# Patient Record
Sex: Female | Born: 1960 | Race: Asian | Hispanic: No | Marital: Married | Smoking: Never smoker
Health system: Western US, Academic
[De-identification: ages and names within clinical notes are randomized; demographics above are authoritative.]

---

## 2003-12-14 ENCOUNTER — Emergency Department (HOSPITAL_COMMUNITY): Admission: EM | Admit: 2003-12-14 | Discharge: 2003-12-14 | Payer: Self-pay | Admitting: Emergency Medicine

## 2006-04-20 ENCOUNTER — Inpatient Hospital Stay (HOSPITAL_COMMUNITY): Admission: AD | Admit: 2006-04-20 | Discharge: 2006-04-20 | Payer: Self-pay | Admitting: Obstetrics and Gynecology

## 2006-05-20 ENCOUNTER — Ambulatory Visit: Payer: Self-pay | Admitting: Obstetrics and Gynecology

## 2006-05-28 ENCOUNTER — Encounter (INDEPENDENT_AMBULATORY_CARE_PROVIDER_SITE_OTHER): Payer: Self-pay | Admitting: *Deleted

## 2006-05-28 ENCOUNTER — Ambulatory Visit: Payer: Self-pay | Admitting: Gynecology

## 2006-05-28 ENCOUNTER — Encounter (INDEPENDENT_AMBULATORY_CARE_PROVIDER_SITE_OTHER): Payer: Self-pay | Admitting: Gynecology

## 2007-06-02 ENCOUNTER — Emergency Department (HOSPITAL_COMMUNITY): Admission: EM | Admit: 2007-06-02 | Discharge: 2007-06-02 | Payer: Self-pay | Admitting: Emergency Medicine

## 2010-03-31 ENCOUNTER — Encounter: Payer: Self-pay | Admitting: *Deleted

## 2010-07-26 NOTE — Group Therapy Note (Signed)
[name], [name] NO.:  [id number]   MEDICAL RECORD NO.:  [id number]          PATIENT TYPE:  WOC   LOCATION:  [facility]                   FACILITY:  [facility]   PHYSICIAN:  Ginger Carne, MD DATE OF BIRTH:  01/10/1961   DATE OF SERVICE:  05/28/2006                                  CLINIC NOTE   The patient is here today for routine annual physical exam and Pap.  She  had 1 episode of heavy bleeding in February of 2008 seen in the [facility].  At  that time her laboratory work was within normal limits.  Specifically,  her H&H was 39.6 and 13.9 with a negative serum pregnancy test.  She  presents today with no specific complaints.  Menses regular every 30  days lasting 3-4 days.  She has had 3 full-term pregnancies in the past.  No specific medical problems and interpreter is with the patient.   SALIENT PHYSICAL EXAM FINDINGS:  Blood pressure 115/78, weight 139  pounds, height 5 feet 5-1/4 inches.  HEENT:  Grossly normal.  Breast exam without masses, discharge, thickenings or tenderness.  CHEST:  Clear to percussion and auscultation.  Cardiovascular exam without murmurs or enlargements.  Regular rate and  rhythm.  Extremities, lymphatic, skin, neurological, musculoskeletal systems  within normal limits.  ABDOMEN:  Soft without gross hepatosplenomegaly.  PELVIC:  Pap smear performed external genitalia, vulva, and vagina  normal.  Cervix smooth without erosions or lesions.   IMPRESSION:  Normal exam.   PLAN:  Mammogram ordered.           ______________________________  Ginger Carne, MD     SHB/MEDQ  D:  05/28/2006  T:  05/28/2006  Job:  [id number]

## 2010-12-02 LAB — POCT I-STAT, CHEM 8
BUN: 11
Calcium, Ion: 1.18
Chloride: 108
Creatinine, Ser: 0.9
Glucose, Bld: 115 — ABNORMAL HIGH
HCT: 40
Hemoglobin: 13.6
Potassium: 4
Sodium: 141
TCO2: 23

## 2010-12-02 LAB — POCT CARDIAC MARKERS
CKMB, poc: <1 — ABNORMAL LOW
Myoglobin, poc: 28.6
Operator id: 295131
Troponin i, poc: <0.05

## 2010-12-02 LAB — ETHANOL: Alcohol, Ethyl (B): 37 — ABNORMAL HIGH

## 2010-12-14 ENCOUNTER — Emergency Department (HOSPITAL_COMMUNITY)
Admission: EM | Admit: 2010-12-14 | Discharge: 2010-12-15 | Disposition: A | Discharge status: Home / Self Care | Payer: Medicaid Other | Attending: Emergency Medicine | Admitting: Emergency Medicine

## 2010-12-14 ENCOUNTER — Emergency Department (HOSPITAL_COMMUNITY): Payer: Medicaid Other

## 2010-12-14 DIAGNOSIS — S61209A Unspecified open wound of unspecified finger without damage to nail, initial encounter: Secondary | ICD-10-CM | POA: Insufficient documentation

## 2010-12-14 DIAGNOSIS — S8010XA Contusion of unspecified lower leg, initial encounter: Secondary | ICD-10-CM | POA: Insufficient documentation

## 2010-12-14 DIAGNOSIS — W540XXA Bitten by dog, initial encounter: Secondary | ICD-10-CM | POA: Insufficient documentation

## 2010-12-14 DIAGNOSIS — M79609 Pain in unspecified limb: Secondary | ICD-10-CM | POA: Insufficient documentation

## 2010-12-14 DIAGNOSIS — S81009A Unspecified open wound, unspecified knee, initial encounter: Secondary | ICD-10-CM | POA: Insufficient documentation

## 2010-12-14 DIAGNOSIS — S91009A Unspecified open wound, unspecified ankle, initial encounter: Secondary | ICD-10-CM | POA: Insufficient documentation

## 2013-07-11 ENCOUNTER — Emergency Department (HOSPITAL_COMMUNITY): Payer: Medicaid Other

## 2013-07-11 ENCOUNTER — Emergency Department (HOSPITAL_COMMUNITY)
Admission: EM | Admit: 2013-07-11 | Discharge: 2013-07-11 | Disposition: A | Discharge status: Home / Self Care | Payer: Medicaid Other | Attending: Emergency Medicine | Admitting: Emergency Medicine

## 2013-07-11 ENCOUNTER — Encounter (HOSPITAL_COMMUNITY): Payer: Self-pay | Admitting: Emergency Medicine

## 2013-07-11 DIAGNOSIS — M5116 Intervertebral disc disorders with radiculopathy, lumbar region: Secondary | ICD-10-CM

## 2013-07-11 DIAGNOSIS — R209 Unspecified disturbances of skin sensation: Secondary | ICD-10-CM | POA: Insufficient documentation

## 2013-07-11 DIAGNOSIS — IMO0002 Reserved for concepts with insufficient information to code with codable children: Secondary | ICD-10-CM | POA: Insufficient documentation

## 2013-07-11 DIAGNOSIS — M51379 Other intervertebral disc degeneration, lumbosacral region without mention of lumbar back pain or lower extremity pain: Secondary | ICD-10-CM | POA: Insufficient documentation

## 2013-07-11 DIAGNOSIS — Z791 Long term (current) use of non-steroidal anti-inflammatories (NSAID): Secondary | ICD-10-CM | POA: Insufficient documentation

## 2013-07-11 DIAGNOSIS — M5137 Other intervertebral disc degeneration, lumbosacral region: Secondary | ICD-10-CM | POA: Insufficient documentation

## 2013-07-11 DIAGNOSIS — R5383 Other fatigue: Secondary | ICD-10-CM

## 2013-07-11 DIAGNOSIS — R5381 Other malaise: Secondary | ICD-10-CM | POA: Insufficient documentation

## 2013-07-11 MED ORDER — IBUPROFEN 200 MG PO TABS
600.0000 mg | ORAL_TABLET | Freq: Once | ORAL | Status: AC
  Administered 2013-07-11: 600 mg via ORAL
  Filled 2013-07-11: qty 3

## 2013-07-11 MED ORDER — ONDANSETRON 4 MG PO TBDP
8.0000 mg | ORAL_TABLET | Freq: Once | ORAL | Status: AC
  Administered 2013-07-11: 8 mg via ORAL
  Filled 2013-07-11: qty 2

## 2013-07-11 MED ORDER — OXYCODONE-ACETAMINOPHEN 5-325 MG PO TABS
2.0000 | ORAL_TABLET | Freq: Once | ORAL | Status: AC
  Administered 2013-07-11: 2 via ORAL
  Filled 2013-07-11: qty 2

## 2013-07-11 MED ORDER — DIAZEPAM 2 MG PO TABS
2.0000 mg | ORAL_TABLET | Freq: Once | ORAL | Status: AC
  Administered 2013-07-11: 2 mg via ORAL
  Filled 2013-07-11: qty 1

## 2013-07-11 MED ORDER — IBUPROFEN 400 MG PO TABS: 400.0000 mg | ORAL_TABLET | Freq: Four times a day (QID) | ORAL | Status: AC | PRN

## 2013-07-11 MED ORDER — PREDNISONE 50 MG PO TABS: 50.0000 mg | ORAL_TABLET | Freq: Every day | ORAL | Status: AC

## 2013-07-11 MED ORDER — ONDANSETRON HCL 4 MG PO TABS: 4.0000 mg | ORAL_TABLET | Freq: Four times a day (QID) | ORAL | Status: AC

## 2013-07-11 MED ORDER — HYDROCODONE-ACETAMINOPHEN 5-325 MG PO TABS: 1.0000 | ORAL_TABLET | Freq: Four times a day (QID) | ORAL | Status: AC | PRN

## 2013-07-11 MED ORDER — METHOCARBAMOL 500 MG PO TABS: 500.0000 mg | ORAL_TABLET | Freq: Two times a day (BID) | ORAL | Status: AC

## 2013-07-11 NOTE — ED Notes (Signed)
Pt vomited after xray requested med

## 2013-07-11 NOTE — ED Notes (Signed)
Pt was still nauseated abd given rx for zofran to take home

## 2013-07-11 NOTE — Discharge Instructions (Signed)
We saw you in the ER for back pain. Our exam don't indicate any spinal cord involvement - and thus we feel comfortable sending you home. WE SUSPECT SEVERE DISK DISEASE. Please take the ibuprofen every 6 hours for the next 2 days, take the muscle relaxant as needed, and see your primary care doctor for further pain control.  Please use the back exercises to strengthen the back muscles. See the neuro  Surgeon as requested.  RESOURCE GUIDE  Chronic Pain Problems: Contact [facility]  [phone] Patients need to be referred by their primary care doctor.  Insufficient Money for Medicine: Contact [employer] Way:  call "211."   No Primary Care Doctor: - Call [facility]  [phone] - can help you locate a primary care doctor that  accepts your insurance, provides certain services, etc. - Physician Referral Service- [phone]  Agencies that provide inexpensive medical care: - [facility]  [phone] - [facility]  [phone] - [facility]  [phone] - Women's Clinic  [phone] - [facility]  [phone] [facility]  [phone]  Medicaid-accepting [facility] Providers: - [facility]- [address]  [phone], Mon-Fri 9am-7pm, Sat 9am-1pm - [facility]- [address] [place], [address] [place]  [phone] - [facility]- [address], [address] [place]  [phone] [facility]- [address]  [phone] - Renaye Rakers- [address] [place], [address], [phone]  Only accepts [place] patients after they have their name  applied to their card  Self Pay (no insurance) in [place]: - Sickle Cell Patients: Dr Willey Blade, [facility]  [address] [place], [phone] - [facility]- [address] [place]  [phone]       [facility] [place]- [address]        -     [facility]- see information above (Speak to [employer] if you do not have insurance)       -  [facility]- [address],  [phone]       -  [facility]- [address] [phone]       -  Dr Julio Sicks-  [address], [place], [phone]       -  [facility] - [address] [phone]       -  [facility]- [address] [phone], also [address], [phone]       -    [facility]- [address] [place], [phone], 1st & 3rd Saturday        every month, 10am-1pm  [facility] [address] [place], [place] [address] [phone]  [facility] [address] [place], [place] [address] [phone]  1) Find a Doctor and Pay Out of Pocket Although you won't have to find out who is covered by your insurance plan, it is a good idea to ask around and get recommendations. You will then need to call the office and see if the doctor you have chosen will accept you as a new patient and what types of options they offer for patients who are self-pay. Some doctors offer discounts or will set up payment plans for their patients  who do not have insurance, but you will need to ask so you aren't surprised when you get to your appointment.  2) Contact Your Local Health Department Not all health departments have doctors that can see patients for sick visits, but many do, so it is worth a call to see if yours does. If you don't know where your local health department is, you can check in your phone book. The CDC also has a tool to help you locate your state's health department, and many state websites also have listings of all of their local health departments.  3) Find a Walk-in Clinic If your illness is not likely to be very severe or complicated, you may want to try a walk in clinic. These are popping up all over the country in pharmacies, drugstores, and  shopping centers. They're usually staffed by nurse practitioners or physician assistants that have been trained to treat common illnesses and complaints. They're usually fairly quick and inexpensive. However, if you have serious medical issues or chronic medical problems, these are probably not your best option  STD Testing - [facility] of [facility], STD Clinic, [address] [place], phone [phone] or [phone].  Monday - Friday, call for an appointment. [facility] of [employer], STD Clinic, [address], [place], phone [phone] or [phone].  Monday - Friday, call for an appointment.  Abuse/Neglect: [facility] Child Abuse Hotline 863-343-9191 [facility] Child Abuse Hotline [phone]) 534-8167 (After Hours)  Emergency Shelter:  [facility] [phone]  Maternity Homes: - [facility] [phone] - [facility] [phone]  MRSA Hotline #:   (651)575-7662  Dental Assistance If unable to pay or uninsured, contact:  [facility]. to become qualified for the adult dental clinic.  Patients with Medicaid: [facility] [phone] [address], [phone] [address], [phone]  If unable to pay, or uninsured, contact [facility] [phone] in [place], [phone] in [facility]) to become qualified for the adult dental clinic  [facility] [address] [place], [place] [address] [phone] [email or web address]  Other Proofreader Services: - [facility]- [address] [place], [place], [place], [address], [phone], Ext. [phone], 2nd and 4th Thursday of the month at 6:30am.  10 clients each day by appointment, can sometimes see walk-in patients if someone does not show for an appointment. [facility]- [address] [place], [place], [address],  [phone] - [facility]- [address] [place], [place], [address], [phone] [facility]- [phone] [facility]- [phone] [facility] [phone]          Herniated Disk The bones of your spinal column (vertebrae) protect your spinal cord and nerves that go into your arms and legs. The vertebrae are separated by disks that cushion the spinal column and put space between your vertebrae. This allows movement between the vertebrae, which allows you to bend, rotate, and move your body from side to side. Sometimes, the disks move out of place (herniate) or break open (rupture) from injury or strain. The most common area for a disk herniation is in the lower back (lumbar area). Sometimes herniation occurs in the neck (cervical) disks.  CAUSES  As we grow older, the strong, fibrous cords that connect the vertebrae and support and surround the disks (  ligaments) start to weaken. A strain on the back may cause a break in the disk ligaments. RISK FACTORS Herniated disks occur most often in men who are aged 18 years to 35 years, usually after strenuous activity. Other risk factors include conditions present at birth (congenital) that affect the size of the lumbar spinal canal. Additionally, a narrowing of the areas where the nerves exit the spinal canal can occur as you age. SYMPTOMS  Symptoms of a herniated disk vary. You may have weakness in certain muscles. This weakness can include difficulty lifting your leg or arm, difficulty standing on your toes on one side, or difficulty squeezing tightly with one of your hands. You may have numbness. You may feel a mild tingling, dull ache, or a burning or pulsating pain. In some cases, the pain is severe enough that you are unable to move. The pain most often occurs on one side of the body. The pain often starts slowly. It may get worse:  After you sit or stand.  At  night.  When you sneeze, cough, or laugh.  When you bend backwards or walk more than a few yards. The pain, numbness, or weakness will often go away or improve a lot over a period of weeks to months. Herniated lumbar disk Symptoms of a herniated lumbar disk may include sharp pain in one part of your leg, hip, or buttocks and numbness in other parts. You also may feel pain or numbness on the back of your calf or the top or sole of your foot. The same leg also may feel weak. Herniated cervical disk Symptoms of a herniated cervical disk may include pain when you move your neck, deep pain near or over your shoulder blade, or pain that moves to your upper arm, forearm, or fingers. DIAGNOSIS  To diagnose a herniated disk, your caregiver will perform a physical exam. Your caregiver also may perform diagnostic tests to see your disk or to test the reaction of your muscles and the function of your nerves. During the physical exam, your caregiver may ask you to:  Sit, stand, and walk. While you walk, your caregiver may ask you to try walking on your toes and then your heels.  Bend forward, backward, and sideways.  Raise your shoulders, elbow, wrist, and fingers and check your strength during these tasks. Your caregiver will check for:  Numbness or loss of feeling.  Muscle reflexes, which may be slower or missing.  Muscle strength, which may be weaker.  Posture or the way your spine curves. Diagnostic tests that may be done include:  A spinal X-ray exam to rule out other causes of back pain.  Magnetic resonance imaging (MRI) or computed tomography (CT) scan, which will show if the herniated disk is pressing on your spinal canal.  Electromyography. This is sometimes used to identify the specific area of nerve involvement. TREATMENT  Initial treatment for a herniated disk is a short period of rest with medicines for pain. Pain medicines can include nonsteroidal anti-inflammatory medicines  (NSAIDs), muscle relaxants for back spasms, and (rarely) narcotic pain medicine for severe pain that does not respond to NSAID use. Bed rest is often limited to 1 or 2 days at the most because prolonged rest can delay recovery. When the herniation involves the lower back, sitting should be avoided as much as possible because sitting increases pressure on the ruptured disk. Sometimes a soft neck collar will be prescribed for a few days to weeks to help support your  neck in the case of a cervical herniation. Physical therapy is often prescribed for patients with disk disease. Physical therapists will teach you how to properly lift, dress, walk, and perform other activities. They will work on strengthening the muscles that help support your spine. In some cases, physical therapy alone is not enough to treat a herniated disk. Steroid injections along the involved nerve root may be needed to help control pain. The steroid is injected in the area of the herniated disk and helps by reducing swelling around the disk. Sometimes surgery is the best option to treat a herniated disk.  SEEK IMMEDIATE MEDICAL CARE IF:   You have numbness, tingling, weakness, or problems with the use of your arms or legs.  You have severe headaches that are not relieved with the use of medicines.  You notice a change in your bowel or bladder control.  You have increasing pain in any areas of your body.  You experience shortness of breath, dizziness, or fainting. MAKE SURE YOU:   Understand these instructions.  Will watch your condition.  Will get help right away if you are not doing well or get worse. Document Released: 02/22/2000 Document Revised: 05/19/2011 Document Reviewed: 09/27/2010 [facility] [place] [facility] Patient Information 2014 [place], [place].  Herniated Disk  with Rehab Between each vertebrae of the spine exists a disk. These disks contain a jelly-like material that helps cushion the spinal column. Occasionally, damage to the  supportive ligaments of the vertebrae causes a disk to shift from its normal alignment and place pressure on surrounding structures, such as the spinal cord. This is called a herniated (ruptured) disk. SYMPTOMS   Pain in the back, that often affects one side.  Pain that gets worse with movement, sneezing, coughing, or straining.  Muscle spasms in the back.  Pain, numbness, or weakness affecting one arm or leg (depending on whether injury is in the neck or low back).  Muscle loss (if the condition has become chronic).  Loss of stool (bowel) or urine (bladder) function. CAUSES  Herniated disks result when a disk becomes weak. The disk eventually ruptures and places pressure on the spinal cord. Herniated disks may occur from sudden injury (acute trauma) such as heavy labor, or from ongoing (chronic) stress, such as obesity.  RISK INCREASES WITH:  Sports that involve downward or twisting pressure on the neck or spine (football, weightlifting, horseback riding competition, bowling, tennis, jogging, track, racquetball, gymnastics).  Poor strength and flexibility.  Failure to warm up properly before activity.  Family history of low back pain or disk disorders.  Previous back surgery (especially fusion).  Preexisting forward displacement of a vertebra (spondylolisthesis).  Poor technique when lifting.  Prolonged sitting, especially with poor posture. PREVENTION  Learn and use proper technique when sitting or lifting.  Warm up and stretch properly before activity.  Maintain physical fitness:  Strength, flexibility, and endurance.  Cardiovascular fitness.  Maintain a healthy body weight.  If previously injured, avoid any intense physical activity that requires twisting of the body under uncontrollable conditions. PROGNOSIS  If treated properly, herniated disks are usually curable within 6 weeks. Sometimes, surgery is required.  RELATED COMPLICATIONS   Permanent numbness,  weakness, or paralysis and muscle loss.  Chronic back pain.  Loss of bowel or bladder function.  Decreased sexual function.  Risks of surgery: infection, bleeding, injury to nerves (persistent or increased numbness, weakness, or paralysis), persistent back pain, and spinal headache. TREATMENT Treatment first involves resting from any aggravating activities and the use  of ice and medicine to reduce pain and inflammation. As muscle spasms begin to decrease, it is important to perform strengthening and stretching exercises of the back muscles. These will help teach and reinforce proper body posture. These exercises may be performed at home, or with a therapist. A therapist may complete a further evaluation and recommend additional treatments, such as ultrasound, traction (for herniated disks of the neck), a cervical collar (for herniated disks of the neck), or a corset or back brace (for herniated disks of the low back). Prolonged rest may do more harm than good. Your therapist will teach you proper techniques for performing simple activities, such as lifting an object off the floor or using proper posture while sitting. At night, it is advised that you sleep on your back, on a firm mattress, and place a pillow under your knees. Your caregiver may recommend oral steroids or an injection of corticosteroids in the space around the spinal cord (epidural space) in order to reduce pain and inflammation. For severe cases, surgery is recommended. MEDICATION   If pain medicine is needed, nonsteroidal anti-inflammatory medicines (aspirin and ibuprofen), or other minor pain relievers (acetaminophen), are often advised.  Do not take pain medicine for 7 days before surgery.  Prescription pain relievers may be given if your caregiver thinks they are needed. Use only as directed and only as much as you need.  Ointments applied to the skin may be helpful.  Corticosteroid injections may be given. These injections  should be reserved for the most serious cases, as they can only be given a certain number of times.  Oral steroids may be given to reduce inflammation, although not usually for severe (acute) injuries. HEAT AND COLD  Cold treatment (icing) relieves pain and reduces inflammation. Cold treatment should be applied for 10 to 15 minutes every 2 to 3 hours, and immediately after activity that aggravates your symptoms. Use ice packs or an ice massage.  Heat treatment may be used before performing stretching and strengthening activities prescribed by your caregiver, physical therapist, or athletic trainer. Use a heat pack or a warm water soak. SEEK MEDICAL CARE IF:   Symptoms get worse or do not improve in 2 to 4 weeks, despite treatment.  You develop loss of bowel or bladder function.  New, unexplained symptoms develop. (Drugs used in treatment may produce side effects.) EXERCISES  RANGE OF MOTION (ROM) AND STRETCHING EXERCISES - Herniated Disk (Ruptured Disk) Most people with low back pain will find that their symptoms get worse with excessive bending forward (flexion) or arching at the low back (extension). The exercises that will help resolve your symptoms will focus on the opposite motion. Your physician, physical therapist or athletic trainer will help you determine which exercises will be most helpful to resolve your low back pain. Do not complete any exercises without first consulting with your caregiver. Discontinue any exercises that make your symptoms worse, until you speak to your caregiver. If you have pain, numbness or tingling that travels down into your buttocks, leg or foot, the goal of this therapy is for these symptoms to move closer to your back and to eventually go away. Sometimes, these leg symptoms will get better, but your low back pain may get worse. This is typically an indication of progress in your rehabilitation. Be sure to be very alert to any changes in your symptoms and to  the activities you have done in the 24 hours prior to the change. Sharing this information with your  caregiver will allow him or her to best treat your condition. These exercises may help you when beginning to rehabilitate your injury. Your symptoms may go away with or without further involvement from your physician, physical therapist or athletic trainer. While completing these exercises, remember:   Restoring tissue flexibility helps normal motion to return to the joints. This allows healthier, less painful movement and activity.  An effective stretch should be held for at least 30 seconds.  A stretch should never be painful. You should only feel a gentle lengthening or release in the stretched tissue. FLEXION RANGE OF MOTION AND STRETCHING EXERCISES: STRETCH  Flexion, Single Knee to Chest  Lie on a firm bed or floor, with both legs extended in front of you.  Keeping one leg in contact with the floor, bring your opposite knee to your chest. Hold your leg in place by either grabbing behind your thigh or at your knee.  Pull until you feel a gentle stretch in your low back. Hold for __________ seconds.  Slowly release your grasp and repeat the exercise with the opposite side. Repeat __________ times. Complete this exercise __________ times per day.  STRETCH  Flexion, Double Knee to Chest   Lie on a firm bed or floor, with both legs extended in front of you.  Keeping one leg in contact with the floor, bring your opposite knee to your chest.  Tense your stomach muscles to support your back and then lift your other knee to your chest. Hold your legs in place by either grabbing behind your thighs or at your knees.  Pull both knees toward your chest until you feel a gentle stretch in your low back. Hold for __________ seconds.  Tense your stomach muscles and slowly return one leg at a time to the floor. Repeat __________ times. Complete this exercise __________ times per day.  STRETCH  Low  Trunk Rotation  Lie on a firm bed or floor. Keeping your legs in front of you, bend your knees so they are both pointed toward the ceiling and your feet are flat on the floor.  Extend your arms out to the side. This will stabilize your upper body by keeping your shoulders in contact with the floor.  Gently and slowly drop both knees together to one side, until you feel a gentle stretch in your low back. Hold for __________ seconds.  Tense your stomach muscles to support your low back as you bring your knees back to the starting position. Repeat the exercise while dropping both knees to the other side. Repeat __________ times. Complete this exercise __________ times per day  EXTENSION RANGE OF MOTION AND FLEXIBILITY EXERCISES: STRETCH  Extension, Prone on Elbows   Lie on your stomach on the floor. (A bed will be too soft.) Place your palms about shoulder width apart.  Place your elbows under your shoulders. If this is too painful, stack pillows under your chest.  Allow your body to relax so that your hips drop lower and make contact more completely with the floor.  Hold this position for __________ seconds.  Slowly return to lying flat on the floor. Repeat __________ times. Complete this exercise __________ times per day.  RANGE OF MOTION  Extension, Prone Press Ups   Lie on your stomach on the floor. (A bed will be too soft.) Place your palms about shoulder width apart and at the height of your head.  Keeping your back as relaxed as possible, slowly straighten your elbows while keeping  your hips on the floor. You may adjust the placement of your hands to maximize your comfort. As you gain motion, your hands will come more underneath your shoulders.  Hold this position for __________ seconds.  Slowly return to lying flat on the floor. Repeat __________ times. Complete this exercise __________ times per day.  RANGE OF MOTION- Quadruped, Neutral Spine   Assume a hands and knees  position on a firm surface. Keep your hands under your shoulders and your knees under your hips. You may place padding under your knees for comfort.  Drop your head and point your tail bone toward the ground below you. This will round out your low back like an angry cat. Hold this position for __________ seconds.  Slowly lift your head and release your tail bone so that your back sags into a large arch, like an old horse.  Hold this position for __________ seconds.  Repeat this until you feel limber in your low back.  Now, find your "sweet spot." This will be the most comfortable position somewhere between the two previous positions. This is your neutral spine. Once you have found this position, tense your stomach muscles to support your low back.  Hold this position for __________ seconds. Repeat __________ times. Complete this exercise __________ times per day.  STRENGTHENING EXERCISES - Herniated Disk (Ruptured Disk) These exercises may help you when beginning to rehabilitate your injury. These exercises should be done near your "sweet spot." This is the neutral, low-back arch, somewhere between fully rounded and fully arched, that is your least painful position. When performed in this safe range of motion, these exercises can be used for people who have either a flexion or extension based injury. These exercises may resolve your symptoms with or without further involvement from your physician, physical therapist or athletic trainer. While completing these exercises, remember:   Muscles can gain both the endurance and the strength needed for everyday activities through controlled exercises.  Complete these exercises as instructed by your physician, physical therapist or athletic trainer. Increase the resistance and repetitions only as guided.  You may experience muscle soreness or fatigue, but the pain or discomfort you are trying to eliminate should never worsen during these exercises. If  this pain does get worse, stop and make sure you are following the directions exactly. If the pain is still present after adjustments, discontinue the exercise until you can discuss the trouble with your clinician. STRENGTHENING Deep Abdominals, Pelvic Tilt   Lie on a firm bed or floor. Keeping your legs in front of you, bend your knees so they are both pointed toward the ceiling and your feet are flat on the floor.  Tense your lower abdominal muscles to press your low back into the floor. This motion will rotate your pelvis so that your tail bone is scooping upwards rather than pointing at your feet or into the floor.  With a gentle tension and even breathing, hold this position for __________ seconds. Repeat __________ times. Complete this exercise __________ times per day.  STRENGTHENING  Abdominals, Crunches   Lie on a firm bed or floor. Keeping your legs in front of you, bend your knees so they are both pointed toward the ceiling and your feet are flat on the floor. Cross your arms over your chest.  Slightly tip your chin down without bending your neck.  Tense your abdominals and slowly lift your trunk high enough so that your shoulder blades are just off the floor. Lifting  higher can put too much stress on the low back and does not further strengthen your abdominal muscles.  With control, return to the starting position. Repeat __________ times. Complete this exercise __________ times per day.  STRENGTHENING  Quadruped, Opposite UE/LE Lift  Assume a hands and knees position on a firm surface. Keep your hands under your shoulders and your knees under your hips. You may place padding under your knees for comfort.  Find your neutral spine and gently tense your abdominal muscles so that you can maintain this position. Your shoulders and hips should form a rectangle that is parallel with the floor and is not twisted.  Keeping your trunk steady, lift your right hand no higher than your  shoulder. Then lift your left leg no higher than your hip. Make sure you are not holding your breath. Hold this position for __________ seconds.  Continuing to keep your abdominal muscles tense and your back steady, slowly return to your starting position. Repeat with the opposite arm and leg. Repeat __________ times. Complete this exercise __________ times per day.  STRENGTHENING  Lower Abdominals, Double Knee Lift  Lie on a firm bed or floor. Keeping your legs in front of you, bend your knees so they are both pointed toward the ceiling and your feet are flat on the floor.  Tense your abdominal muscles to brace your low back and slowly lift both of your knees until they come over your hips. Be certain not to hold your breath.  Hold for __________ seconds. Using your abdominal muscles, return to the starting position in a slow and controlled manner. Repeat __________ times. Complete this exercise __________ times per day.  POSTURE AND BODY MECHANICS CONSIDERATIONS - Herniated Disc (Ruptured Disk) Keeping correct posture when sitting, standing or completing your activities will reduce the stress put on different body tissues, allowing injured tissues a chance to heal and limiting painful experiences. The following are general guidelines for improved posture. Your physician or physical therapist will provide you with any instructions specific to your needs. While reading these guidelines, remember:  The exercises prescribed by your provider will help you build the flexibility and strength to maintain correct postures.  The correct posture provides the best environment for your joints to work. All of your joints have less wear and tear when properly supported by a spine with good posture. This means you will experience a healthier, less painful body.  Correct posture must be practiced with all of your activities, especially prolonged sitting and standing. Correct posture is as important when doing  repetitive low-stress activities (typing) as it is when doing a single heavy-load activity (lifting). RESTING POSITIONS Consider which positions are most painful for you when choosing a resting position. If you have pain with flexion-based activities (sitting, bending, stooping, squatting), choose a position that allows you to rest in a less flexed posture. You would want to avoid curling into a fetal position on your side. If your pain gets worse with extension-based activities (prolonged standing, working overhead), avoid resting in an extended position such as sleeping on your stomach. Most people will find more comfort when they rest with their spine in a more neutral position, neither too rounded nor too arched. Lying on a non-sagging bed on your side, with a pillow between your knees, or on your back with a pillow under your knees will often provide some relief. Keep in mind, being in any one position for a prolonged period of time, no matter how correct  your posture, can still lead to stiffness. PROPER SITTING POSTURE In order to minimize stress and discomfort on your spine, you must sit with correct posture. Sitting with good posture should be effortless for a healthy body. Returning to good posture is a gradual process. Many people can work toward this most comfortably by using various supports until they have the flexibility and strength to maintain this posture on their own. When sitting with proper posture, your ears will fall over your shoulders and your shoulders will fall over your hips. You should use the back of the chair to support your upper back. Your low back will be in a neutral position, just slightly arched. You may place a small pillow or folded towel at the base of your low back for support.  When working at a desk, create an environment that supports good, upright posture. Without extra support, muscles tire, which leads to excessive strain on joints and other tissues. Keep these  recommendations in mind. CHAIR  A chair should be able to slide under your desk when your back makes contact with the back of the chair. This allows you to work closely.  The chair's height should allow your eyes to be level with the upper part of your monitor and your hands to be slightly lower than your elbows. BODY POSITION  Your feet should make contact with the floor. If this is not possible, use a foot rest.  Keep your ears over your shoulders. This will reduce stress on your neck and low back. INCORRECT SITTING POSTURES  If you are feeling tired and unable to assume a healthy sitting posture, do not slouch or slump. This puts excessive strain on your back tissues, causing more damage and pain. Healthier options include:  Using more support, like a lumbar pillow.  Switching tasks, to something that requires you to be upright or walking.  Talking a brief walk.  Lying down to rest in a neutral-spine position. PROLONGED STANDING WHILE SLIGHTLY LEANING FORWARD  When completing a task that requires you to lean forward while standing in one place for a long time, place either foot up on a stationary 2-4 inch high object, to help maintain the best posture. When both feet are on the ground, the low back tends to lose its slight inward curve. If this curve flattens (or becomes too large), the back and your other joints will experience too much stress, tire more quickly and can cause pain. CORRECT STANDING POSTURES Proper standing posture should be assumed with all daily activities, even if they only take a few moments, like when brushing your teeth. As in sitting, your ears should fall over your shoulders and your shoulders should fall over your hips. You should keep a slight tension in your abdominal muscles to brace your spine. Your tailbone should point down to the ground, not behind your body, resulting in an over-extended swayback posture.  INCORRECT STANDING POSTURES  Common incorrect  standing postures include a forward head, locked knees or an excessive swayback. WALKING Walk with an upright posture. Your ears, shoulders and hips should all line-up. PROLONGED ACTIVITY IN A FLEXED POSITION When completing a task that requires you to bend forward at your waist or lean over a low surface, try finding a way to stabilize 3 out of 4 of your limbs. You can place a hand or elbow on your thigh, or rest a knee on the surface you are reaching across. This will provide you more stability so that your  muscles do not tire as quickly. By keeping your knees relaxed, or slightly bent, you will also reduce stress across your low back. CORRECT LIFTING TECHNIQUES DO :   Assume a wide stance. This will provide you more stability and the opportunity to get as close as possible to the object you are lifting.  Tense your abdominals to brace your spine. Then, bend at the knees and hips. Keeping your back locked in a neutral-spine position, lift using your leg muscles. Lift with your legs, keeping your back straight.  Test the weight of unknown objects before attempting to lift them.  Try to keep your elbows down by your sides, in order get the best strength from your shoulders when carrying an object.  Always ask for help when lifting heavy or awkward objects. INCORRECT LIFTING TECHNIQUES DO NOT:   Lock your knees when lifting, even if it is a small object.  Bend and twist. Pivot at your feet or move your feet when needing to change directions.  Assume that you can safely pick up even a paper clip, without proper posture. Document Released: 02/24/2005 Document Revised: 05/19/2011 Document Reviewed: 06/08/2008 [facility] Patient Information 2014 [place], [place].
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
## 2013-07-11 NOTE — ED Provider Notes (Signed)
CSN: [id number]     Arrival date & time 07/11/13  0703 History   First MD Initiated Contact with Patient 07/11/13 [phone]     Chief Complaint  Patient presents with  . Back Pain     (Consider location/radiation/quality/duration/timing/severity/associated sxs/prior Treatment) HPI Comments: SUBJECTIVE:  [name] is a 53 y.o. female who complains low back pain for the past 6 months. The pain was intermittent initially, but now, for the past 1 month, it is constant. The pain is positional with bending or lifting, with radiation down the legs. Pt has numbness, tingling, and weakness in her RLE. Pain at it's worse is 9/10, and when she is laying still, it is still 6/10. Prior history of back problems: no prior back problems. No trauma. No personal hx of cancer. Pt denies associated urinary incontinence, urinary retention, bowel incontinence, saddle anesthesia. Ambulating, with pain.    Patient is a 53 y.o. female presenting with back pain. The history is provided by the patient.  Back Pain Associated symptoms: numbness and weakness   Associated symptoms: no dysuria     History reviewed. No pertinent past medical history. No past surgical history on file. No family history on file. History  Substance Use Topics  . Smoking status: Never Smoker   . Smokeless tobacco: Not on file  . Alcohol Use: Yes   OB History   Grav Para Term Preterm Abortions TAB SAB Ect Mult Living                 Review of Systems  Constitutional: Positive for activity change.  Gastrointestinal: Negative for nausea.  Genitourinary: Negative for dysuria and flank pain.  Musculoskeletal: Positive for back pain. Negative for neck pain.  Neurological: Positive for weakness and numbness.  All other systems reviewed and are negative.     Allergies  Review of patient's allergies indicates no known allergies.  Home Medications   Prior to Admission medications   Medication Sig Start Date End Date Taking?  Authorizing Provider  naproxen (NAPROSYN) 500 MG tablet Take 500 mg by mouth 2 (two) times daily with a meal.   Yes Historical Provider, MD   BP 134/86  Pulse 61  Temp(Src) 97.2 F (36.2 C) (Oral)  Resp 14  SpO2 99% Physical Exam  Nursing note and vitals reviewed. Constitutional: She appears well-developed.  Neck: Neck supple.  Cardiovascular: Normal rate and regular rhythm.   Pulmonary/Chest: She is in respiratory distress.  Abdominal: Soft.  Musculoskeletal:  Pt has tenderness over the lumbar region No step offs, no erythema. Pt has 1+ patellar reflex on the right lower extremity, and it is diminished on the LLE relative to the RLE. Pt has subjective numbness on the LLE compared to the contralateral side. She has a difficult time discriminating between sharp and dull, but is able to tell them apart none the less. Able to ambulate     ED Course  Procedures (including critical care time) Labs Review Labs Reviewed - No data to display  Imaging Review Dg Lumbar Spine Complete  07/11/2013   CLINICAL DATA:  Back pain  EXAM: LUMBAR SPINE - COMPLETE 4+ VIEW  COMPARISON:  None.  FINDINGS: Five lumbar type vertebral bodies are well visualized. There is posterior fusion defect at S1 likely congenital in nature. No focal mass lesion or abnormal calcifications are noted. No pars defects are noted. Anterolisthesis of L4 on L5 is noted of grade 1. This is felt to be degenerative in nature. A well corticated bony density  is noted at the superior aspect of the articular facet of L4 on the left. This may be congenital in nature or may be related to prior injury with non union.  IMPRESSION: Spondylolisthesis of L4 on L5 likely of a congenital nature.  Although not mentioned in the body of the report there is a calcific density to the right of the midline at L4-5. This is likely related to ingested material.   Electronically Signed   By: Alcide CleverMark  Lukens M.D.   On: 07/11/2013 08:40     EKG  Interpretation None      MDM   Final diagnoses:  Lumbar disc disease with radiculopathy    DDx includes: - DJD of the back - Spondylitises/ spondylosis - Sciatica - Spinal cord compression - Conus medullaris - Epidural hematoma - Epidural abscess - Lytic/pathologic fracture - Myelitis - Musculoskeletal pain  Pt comes in with back pain x 6 months, worst over the past month. Has some numbness in the LLE, and exam shows subjective numbness as well. Pt's patellar reflex is diminished on the left side as well. Patient is able to ambulate, and she has no red flags for spinal cord involvement (urinary incontinence, bowel incontinence, urinary retention, saddle anesthesia) - and her hx is very much characteristic of a disk dz or sciatica - so we will get her Nsurgery f/u. meds will be prescribed. [facility] info provided. Return precautions discussed.    Derwood KaplanAnkit Kaio Kuhlman, MD 07/11/13 [phone]

## 2015-02-27 IMAGING — CR DG LUMBAR SPINE COMPLETE 4+V
5 series · 5 of 5 positions shown · non-contrast
Comparison: None.

CLINICAL DATA: Back pain

EXAM:
LUMBAR SPINE - COMPLETE 4+ VIEW

[t lumbar spine ap]
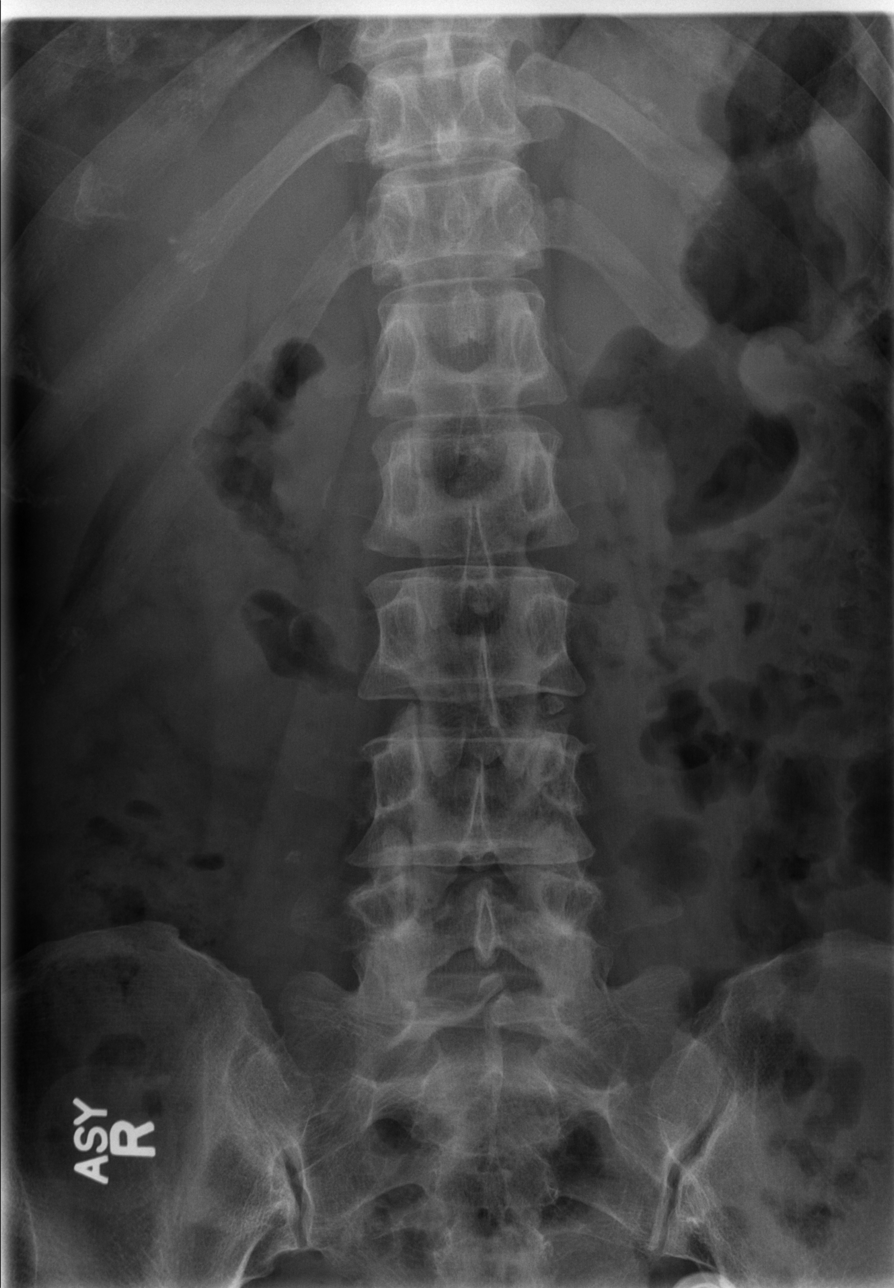

[t lumbar spine obl (1 of 2)]
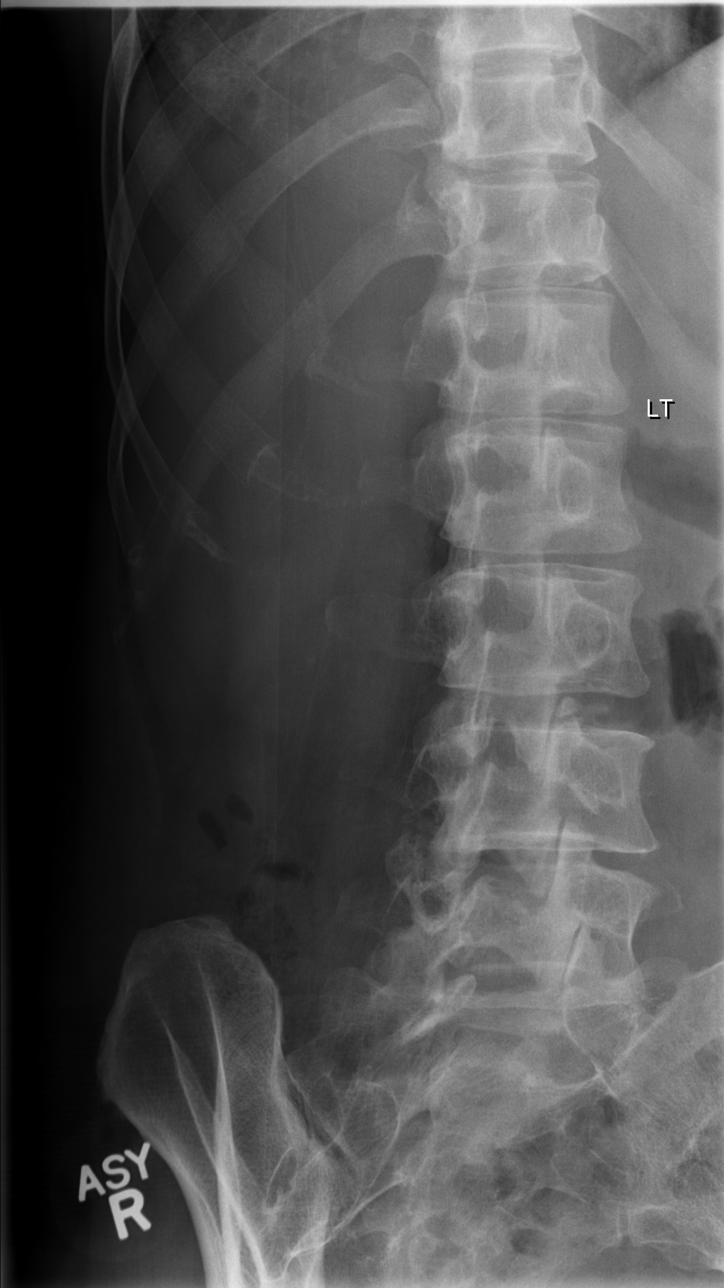

[t lumbar spine obl (2 of 2)]
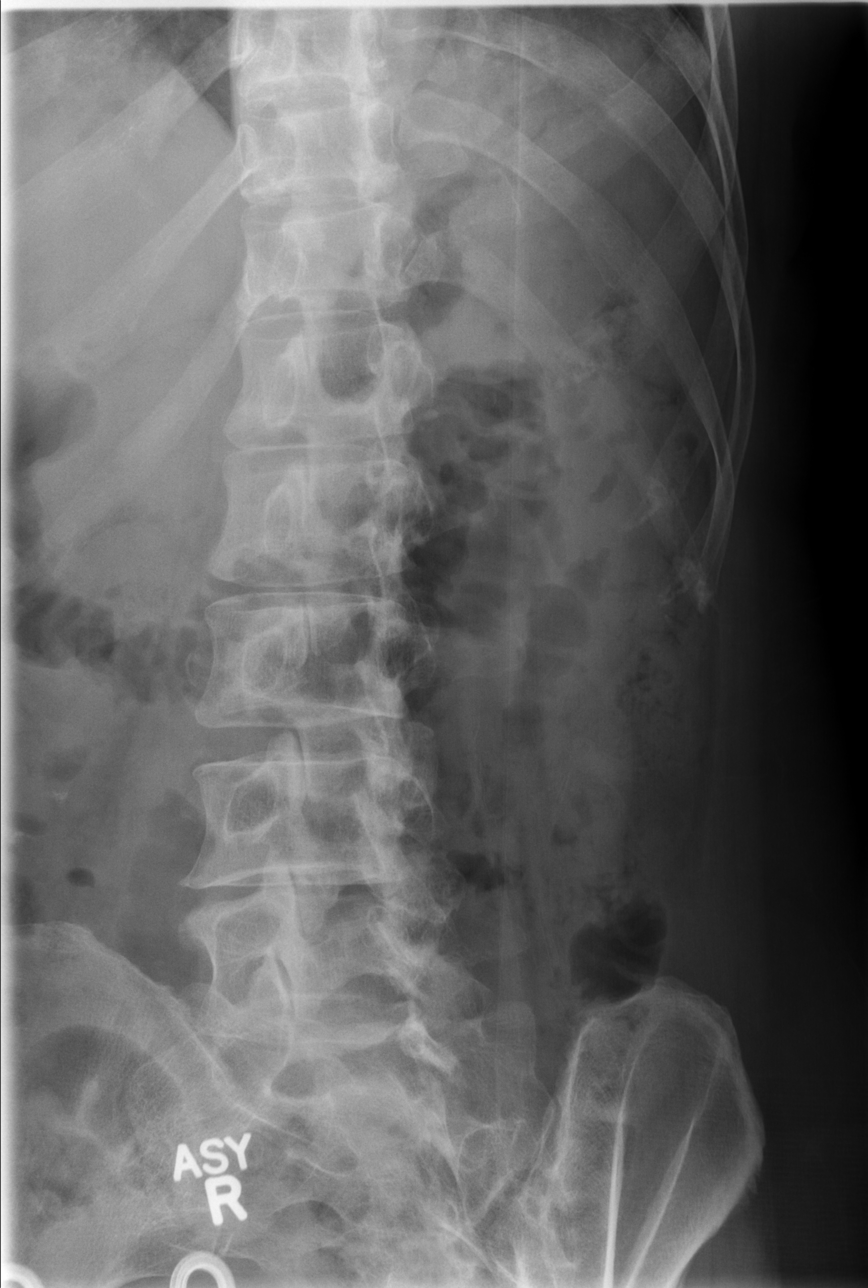

[t lumbar spine lat]
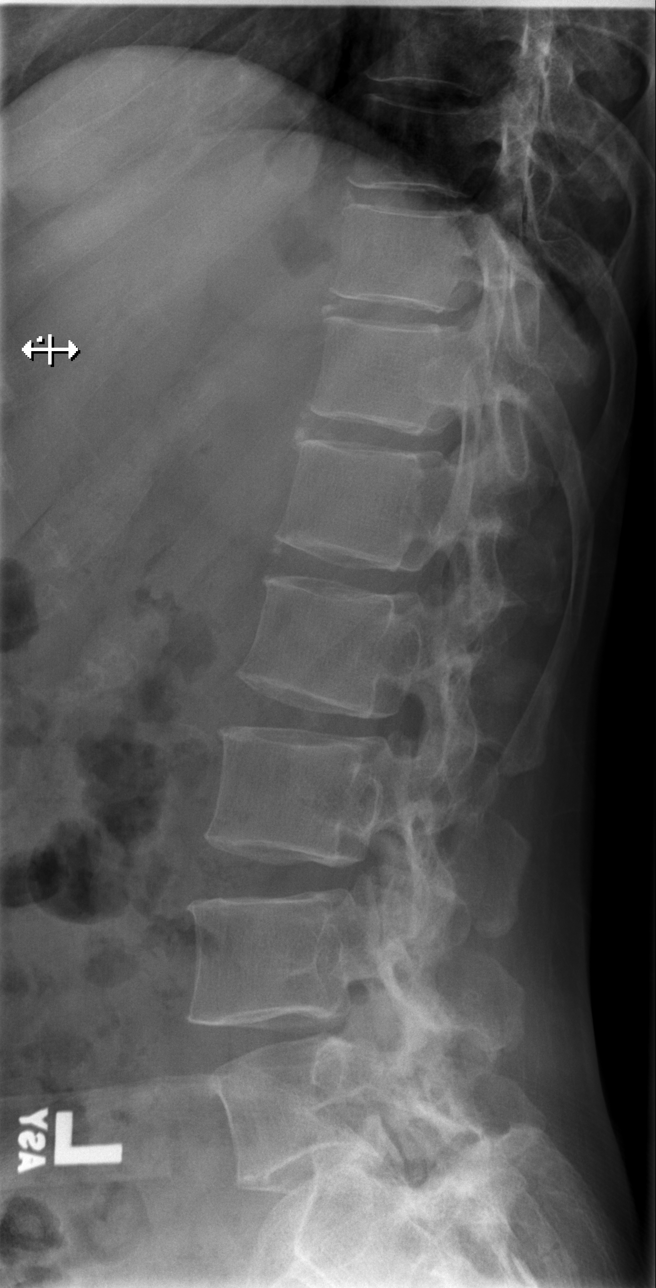

[t lumbar l-5 s-1 spot]
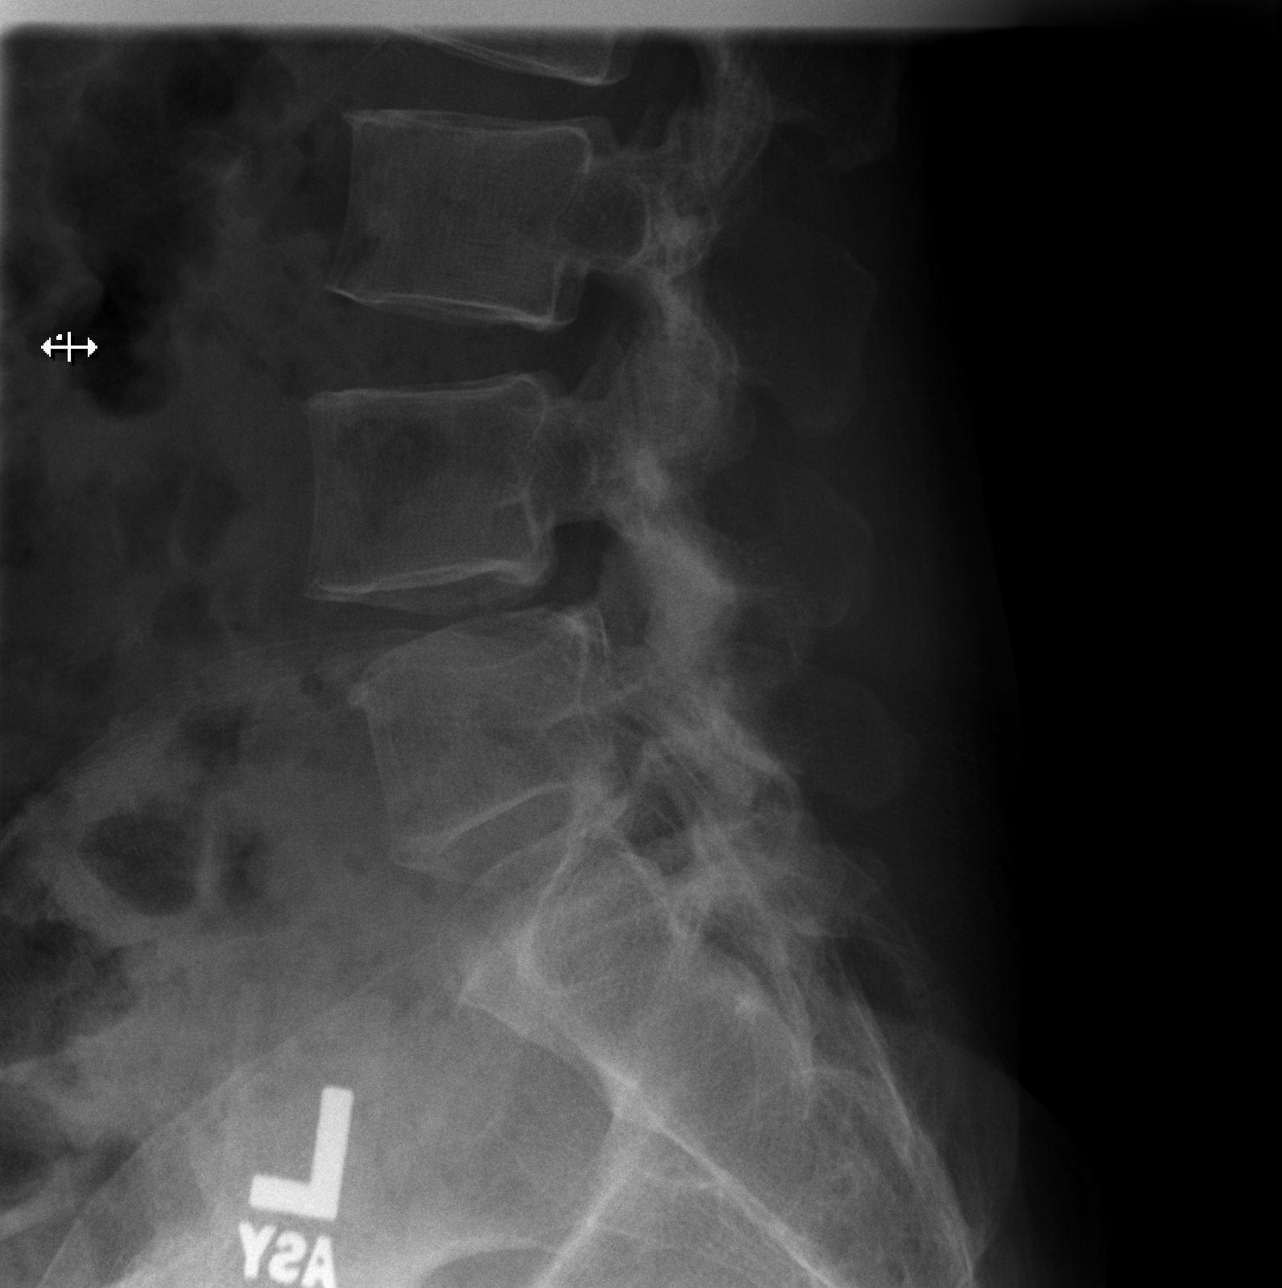

[5 of 5 positions shown; findings below may reference images not displayed]

FINDINGS: Five lumbar type vertebral bodies are well visualized. There is
posterior fusion defect at S1 likely congenital in nature. No focal
mass lesion or abnormal calcifications are noted. No pars defects
are noted. Anterolisthesis of L4 on L5 is noted of grade 1. This is
felt to be degenerative in nature. A well corticated bony density is
noted at the superior aspect of the articular facet of L4 on the
left. This may be congenital in nature or may be related to prior
injury with non union.
IMPRESSION: Spondylolisthesis of L4 on L5 likely of a congenital nature.

Although not mentioned in the body of the report there is a calcific
density to the right of the midline at L4-5. This is likely related
to ingested material.

## 2018-06-11 ENCOUNTER — Telehealth: Payer: Self-pay

## 2018-06-11 NOTE — Telephone Encounter (Addendum)
Patient called in advising her primary care doctor [facility] sent in a referral today to get testeed for COVID-19. Per patient she can not breath, has fever, body pain, fatigue, and nauseous. She has not traveled outside of the [place] in the last 30 days and has not come into contact with a confirmed diagnosis of COVID-19 that she knows of. I called the nurse line and left a voice message. I offered to transfer the patient to the doctor on call at the hospital but she declined and stated she would follow up with her pcp. Please assist thank you.

## 2018-06-14 NOTE — Telephone Encounter (Signed)
Triage Nurse Novel Coronavirus (2019-nCoV) Triage Nurse Action      Triage Nurse Phone Assessment (MUST answer all 4 questions):    Travel  . Have you had any International Travel and/or been around large groups within the past 14 days?    o (Yes / No)  no     Symptoms  . Does patient have any fevers? (temperature higher than 100.4 degrees Fahrenheit)  o (Yes / No)  no   . Does patient have any new lower respiratory symptoms? (e.g., cough, shortness of breath)  o (Yes / No)  No.. chill   - If yes, does patient require hospitalization for their respiratory symptoms?   (Yes / No) no     Contact with Confirmed Novel Coronavirus (2019-nCoV)  . Did patient have direct contact with a laboratory confirmed COVID-19 case in the past 14 days?  o (Yes / No)  no     [facility] COVID-19 Testing Criteria  . Does patient's symptoms or illness trajectory suggest they will have the potential to be hospitalized in the near future?   (Yes / No) no  . Does patient have acute infectious respiratory illness and is high risk for severe disease (such as patients over 74 years old or those with immunocompromising conditions)?   (Yes / No) no  . Is the patient a Health Care worker with symptoms    (Yes/No) no    Disposition/Action Taken:    . Does the patient meet criteria for Testing?    (Yes/No) no  . Additional Instructions given:         Yes,  Advised patient to do self-monitoring at home. Send message to her PCP     . Instruct patient to stay home for next 14 days. Do not go outside home.  . If in need of emergency care, call 911 and inform them of travel/exposure.  . Otherwise, self-monitor twice daily at home for 14 days, looking for:  o Fever (temperature higher than 100.4 degrees Fahrenheit).  o New respiratory symptoms such as cough, shortness of breath, runny nose.  . Try to keep 6-foot distance from household members, as much as possible.  . Wash hands often, surface clean household areas with disinfectant wipes.  . If symptoms appear  during normal office hours (8:00 am-5:00 pm), call your physician's office. If symptoms appear after hours, call [employer] at [phone].

## 2018-06-14 NOTE — Telephone Encounter (Signed)
Left message for the patient to call back included call back phone number.

## 2018-12-06 ENCOUNTER — Ambulatory Visit: Payer: Commercial Managed Care - POS | Attending: Ophthalmology | Admitting: Ophthalmology

## 2018-12-06 ENCOUNTER — Encounter: Payer: Self-pay | Admitting: Ophthalmology

## 2018-12-06 DIAGNOSIS — H40031 Anatomical narrow angle, right eye: Secondary | ICD-10-CM

## 2018-12-06 DIAGNOSIS — H40039 Anatomical narrow angle, unspecified eye: Secondary | ICD-10-CM | POA: Insufficient documentation

## 2018-12-06 HISTORY — PX: IRIDOTOMY / IRIDECTOMY: SHX165

## 2018-12-06 MED ORDER — VITAMIN D (ERGOCALCIFEROL) 1.25 MG (50000 UT) PO CAPS
1.00 | ORAL_CAPSULE | ORAL | Status: AC
Start: 2018-11-13 — End: ?

## 2018-12-06 MED ORDER — PREDNISOLONE ACETATE 1 % OP SUSP
1.0000 [drp] | Freq: Four times a day (QID) | OPHTHALMIC | 1 refills | Status: AC
Start: 2018-12-06 — End: 2018-12-10

## 2018-12-06 NOTE — Progress Notes (Signed)
ICD-10-CM ICD-9-CM    1. Anatomical narrow angle  H40.039 365.02 OCT, Optic Nerve - OU - Both Eyes -       1. Anatomical narrow angle  Appositional closure ou. IOp elevated as well.  recommend urgent LPI OD then OS  RBA explained in detail  The pt understands and wishes to proceed with LPI OD    See procedure note    Follow upin 1-2 weeks for lpi os        I reviewed and confirmed all history components, including the HPI; and any other elements performed by the technician.

## 2018-12-07 ENCOUNTER — Telehealth: Payer: Self-pay | Admitting: Ophthalmology

## 2018-12-07 NOTE — Telephone Encounter (Signed)
Pt is schedule 10.13.2020 w. Dr. Nyoka Cowden and she is wanting to make sure we will be performing the LIP the day of her appt

## 2018-12-21 ENCOUNTER — Encounter: Payer: Self-pay | Admitting: Ophthalmology

## 2018-12-21 ENCOUNTER — Ambulatory Visit: Payer: Commercial Managed Care - PPO | Attending: Ophthalmology | Admitting: Ophthalmology

## 2018-12-21 DIAGNOSIS — H40039 Anatomical narrow angle, unspecified eye: Secondary | ICD-10-CM | POA: Insufficient documentation

## 2018-12-21 DIAGNOSIS — H40032 Anatomical narrow angle, left eye: Secondary | ICD-10-CM

## 2018-12-21 NOTE — Progress Notes (Signed)
ICD-10-CM ICD-9-CM    1. Anatomical narrow angle  H40.039 365.02        1. Anatomical narrow angle  S/p LPI OD 12/06/18, patent  Plan on LPI OS today    RBA explained in detail  The pt understands and wishes to proceed with LPI OS  Pt here for laser peripheral iridotomy OS. Informed consent obtained. PT pre-treated with iopidine and pilocarpine 1%. The pt was then taken to the laser suite where the yag laser was used at 1.69mj power at one shot per burst with #12 spots placed in the superior peripheral iris. Aqueous was noted to be flowing through the patent iridotomy at the termination of the procedure and the pt tolerated the procedure well. Pt to use pred forte qid for 4 days and follow up in 1-2 weeks. IOP 30 minutes post op was acceptable.     Follow up in 1-2 weeks       I reviewed and confirmed all history components, including the HPI; and any other elements performed by the technician. I participated in the key and critical portions of the exam. The resident/fellow and I discussed and jointly formulated the above assessment and plan, and, any image interpretations.

## 2019-01-18 ENCOUNTER — Ambulatory Visit: Payer: Commercial Managed Care - POS | Attending: Ophthalmology | Admitting: Ophthalmology

## 2019-01-18 ENCOUNTER — Encounter: Payer: Self-pay | Admitting: Ophthalmology

## 2019-01-18 DIAGNOSIS — H40039 Anatomical narrow angle, unspecified eye: Secondary | ICD-10-CM

## 2019-01-18 NOTE — Progress Notes (Signed)
ICD-10-CM ICD-9-CM    1. Anatomical narrow angle  H40.039 365.02        1. Anatomical narrow angle  S/p lpi ou - the angles are deeper ou  iop excellent ou  Observe and follow up in 6 mo      I reviewed and confirmed all history components, including the HPI; and any other elements performed by the technician.

## 2019-07-19 ENCOUNTER — Ambulatory Visit: Payer: Self-pay | Admitting: Ophthalmology

## 2023-02-19 ENCOUNTER — Other Ambulatory Visit: Payer: Self-pay | Admitting: Internal Medicine

## 2023-02-19 DIAGNOSIS — Z1231 Encounter for screening mammogram for malignant neoplasm of breast: Secondary | ICD-10-CM

## 2023-02-19 DIAGNOSIS — R102 Pelvic and perineal pain: Secondary | ICD-10-CM

## 2023-02-23 ENCOUNTER — Ambulatory Visit: Payer: Commercial Managed Care - PPO

## 2023-02-23 DIAGNOSIS — Z1231 Encounter for screening mammogram for malignant neoplasm of breast: Secondary | ICD-10-CM

## 2023-08-20 ENCOUNTER — Encounter: Payer: Self-pay | Admitting: Internal Medicine

## 2023-08-20 ENCOUNTER — Encounter

## 2023-08-20 ENCOUNTER — Telehealth: Payer: Self-pay

## 2023-08-20 NOTE — Telephone Encounter (Signed)
 No show for PV apt. Unable to reach patient. VM left using in person interpreter. Pt informed she will need to r/s or her apt or her procedure will be cancelled. Phone number provided.

## 2023-08-28 ENCOUNTER — Encounter

## 2023-08-28 ENCOUNTER — Telehealth: Payer: Self-pay

## 2023-08-28 NOTE — Telephone Encounter (Signed)
 No show for PV apt. Attempted to contact pt via phone. VM left with the help of interpreter. Pt will need to call and r/s by 5PM to avoid cancellation of her colonoscopy with Dr. Rosaline Coma.   Past two apts have been scheduled with the patients son. No contact information listed in the chart. Please obtained if he calls and r/s as well as verify address. Mail was returned from last PV. No updated address on file to mail no show letter. Pt does not have mychart.

## 2023-09-10 ENCOUNTER — Encounter: Payer: Self-pay | Admitting: Internal Medicine
